# Patient Record
Sex: Female | Born: 2000 | Race: White | Hispanic: No | Marital: Single | Smoking: Never smoker
Health system: Southern US, Community
[De-identification: ages and names within clinical notes are randomized; demographics above are authoritative.]

---

## 2020-09-25 ENCOUNTER — Encounter: Payer: Self-pay | Admitting: *Deleted

## 2020-09-25 ENCOUNTER — Emergency Department (INDEPENDENT_AMBULATORY_CARE_PROVIDER_SITE_OTHER)
Admission: EM | Admit: 2020-09-25 | Discharge: 2020-09-25 | Disposition: A | Discharge status: Home / Self Care | Payer: 59 | Source: Home / Self Care

## 2020-09-25 ENCOUNTER — Other Ambulatory Visit: Payer: Self-pay

## 2020-09-25 ENCOUNTER — Emergency Department (INDEPENDENT_AMBULATORY_CARE_PROVIDER_SITE_OTHER): Payer: 59

## 2020-09-25 DIAGNOSIS — W231XXA Caught, crushed, jammed, or pinched between stationary objects, initial encounter: Secondary | ICD-10-CM | POA: Diagnosis not present

## 2020-09-25 DIAGNOSIS — M79644 Pain in right finger(s): Secondary | ICD-10-CM | POA: Diagnosis not present

## 2020-09-25 DIAGNOSIS — S6991XA Unspecified injury of right wrist, hand and finger(s), initial encounter: Secondary | ICD-10-CM | POA: Diagnosis not present

## 2020-09-25 NOTE — ED Provider Notes (Signed)
[facility]    CSN: [id number] Arrival date & time: 09/25/20  1052      History   Chief Complaint Chief Complaint  Patient presents with  . Finger Injury    HPI Miranda Wright is a 20 y.o. female.   HPI  Patient Miranda Wright today with a right third digit injury.  Patient reports her sister accidentally slammed her finger in the car door last evening.  She immediately applied ice to reduce swelling had some mild tenderness.  Today upon awakening her finger was more swollen from the PIP joint distally with some mild misalignment.  Patient can flex her finger however with extension there is some pain.  No prior injury or fracture to affected digit.  History reviewed. No pertinent past medical history.  There are no problems to display for this patient.   History reviewed. No pertinent surgical history.  OB History   No obstetric history on file.      Home Medications    Prior to Admission medications   Not on File    Family History History reviewed. No pertinent family history.  Social History Social History   Tobacco Use  . Smoking status: Never Smoker  . Smokeless tobacco: Never Used  Vaping Use  . Vaping Use: Never used  Substance Use Topics  . Alcohol use: Never  . Drug use: Never     Allergies   Patient has no known allergies.   Review of Systems Review of Systems Pertinent negatives listed in HPI  Physical Exam Triage Vital Signs ED Triage Vitals  Enc Vitals Group     BP 09/25/20 1116 130/85     Pulse Rate 09/25/20 1116 (!) 2     Resp 09/25/20 1116 18     Temp 09/25/20 1116 98.7 F (37.1 C)     Temp Source 09/25/20 1116 Oral     SpO2 09/25/20 1116 99 %     Weight 09/25/20 1113 115 lb (52.2 kg)     Height 09/25/20 1113 5\' 3"  (1.6 m)     Head Circumference --      Peak Flow --      Pain Score 09/25/20 1113 5     Pain Loc --      Pain Edu? --      Excl. in GC? --    No data found.  Updated Vital Signs BP 130/85 (BP Location:  Right Arm)   Pulse (!) 2   Temp 98.7 F (37.1 C) (Oral)   Resp 18   Ht 5\' 3"  (1.6 m)   Wt 115 lb (52.2 kg)   LMP 09/04/2020   SpO2 99%   BMI 20.37 kg/m   Visual Acuity Right Eye Distance:   Left Eye Distance:   Bilateral Distance:    Right Eye Near:   Left Eye Near:    Bilateral Near:     Physical Exam General appearance: alert, well developed, well nourished, cooperative  Head: Normocephalic, without obvious abnormality, atraumatic Respiratory: Respirations even and unlabored, normal respiratory rate Heart: rate and rhythm normal. No gallop or murmurs noted on exam  Abdomen: BS +, no distention, no rebound tenderness, or no mass Extremities:Right middle digit swelling, scant ecchymosis base of finger, full flexion pain with extension Skin: Skin color, texture, turgor normal. No rashes seen  Psych: Appropriate mood and affect. Neurologic: Mental status: Alert, oriented to person, place, and time, thought content appropriate.   UC Treatments / Results  Labs (all labs ordered are  listed, but only abnormal results are displayed) Labs Reviewed - No data to display  EKG   Radiology DG Hand Complete Right  Result Date: 09/25/2020 CLINICAL DATA:  Right hand pain after car door injury last night. EXAM: RIGHT HAND - COMPLETE 3+ VIEW COMPARISON:  None. FINDINGS: There is no evidence of fracture or dislocation. There is no evidence of arthropathy or other focal bone abnormality. Soft tissues are unremarkable. IMPRESSION: Negative. Electronically Signed   By: Lupita Raider M.D.   On: 09/25/2020 12:23     Procedures Procedures (including critical care time)  Medications Ordered in UC Medications - No data to display  Initial Impression / Assessment and Plan / UC Course  I have reviewed the triage vital signs and the nursing notes.  Pertinent labs & imaging results that were available during my care of the patient were reviewed by me and considered in my medical decision  making (see chart for details).     Right hand imaging, third digit is negative for any acute fracture.  Treating as a sprain finger has been splinted with Coban.  Patient encouraged to take at least 2-3 doses of ibuprofen and continue icing to reduce swelling over the next 1 to 2 days.  Follow-up with primary care if symptoms worsen or do not improve with prescribed therapy.  Final Clinical Impressions(s) / UC Diagnoses   Final diagnoses:  Finger injury, right, initial encounter     Discharge Instructions     Recommend taking 400 mg of ibuprofen at least 2-3 times until swelling and inflammation resolves.  Continue icing for the next 1 to 2 days to relieve residual swelling.  Continue use of the Coban over the next 1 to 3 days until full range of motion returns.  If symptoms worsen or do not improve follow-up with your primary care provider.  Your x-ray showed no fracture.    ED Prescriptions    None     PDMP not reviewed this encounter.   Bing Neighbors, FNP 09/25/20 1311

## 2020-09-25 NOTE — Discharge Instructions (Addendum)
Recommend taking 400 mg of ibuprofen at least 2-3 times until swelling and inflammation resolves.  Continue icing for the next 1 to 2 days to relieve residual swelling.  Continue use of the Coban over the next 1 to 3 days until full range of motion returns.  If symptoms worsen or do not improve follow-up with your primary care provider.  Your x-ray showed no fracture.

## 2020-09-25 NOTE — ED Triage Notes (Signed)
Pt closed her RT 3rd finger in a door last night. C/o pain, swelling and bruising. No OTC meds today. Iced this morning.

## 2021-12-30 IMAGING — DX DG HAND COMPLETE 3+V*R*
3 series · 3 of 3 positions shown · non-contrast
Comparison: None.

CLINICAL DATA: Right hand pain after car door injury last night.

EXAM:
RIGHT HAND - COMPLETE 3+ VIEW

[hand pa]
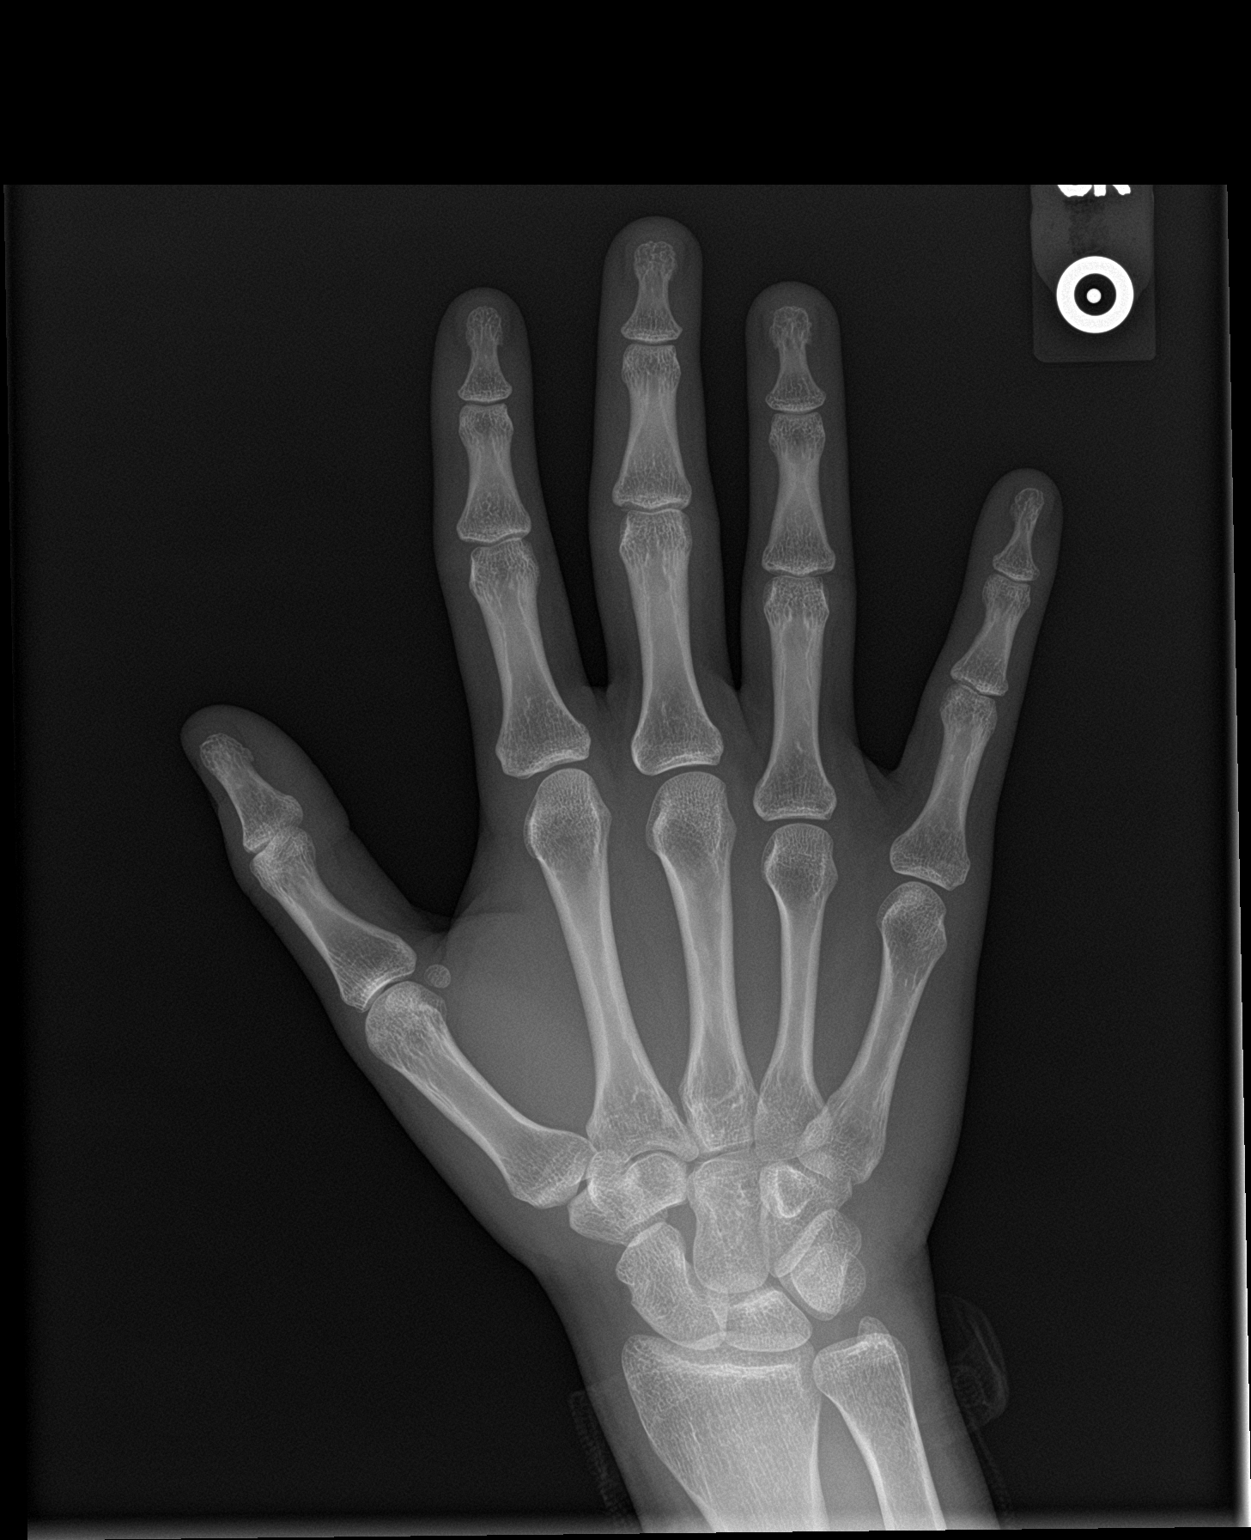

[hand obl]
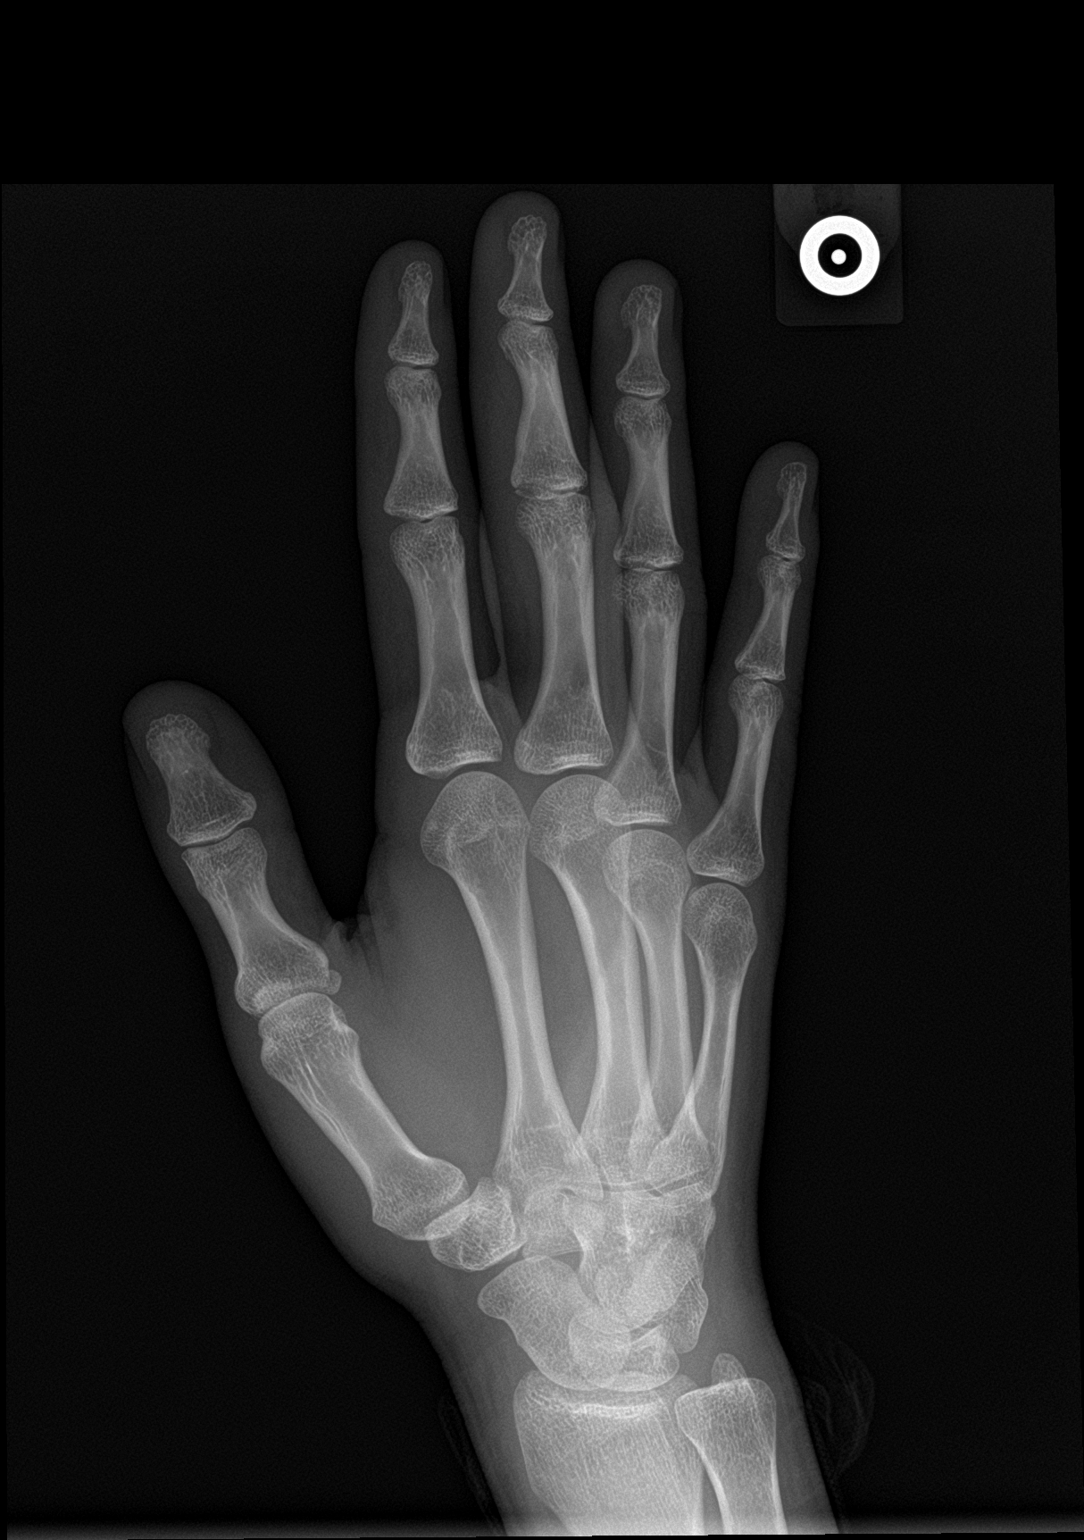

[hand lat]
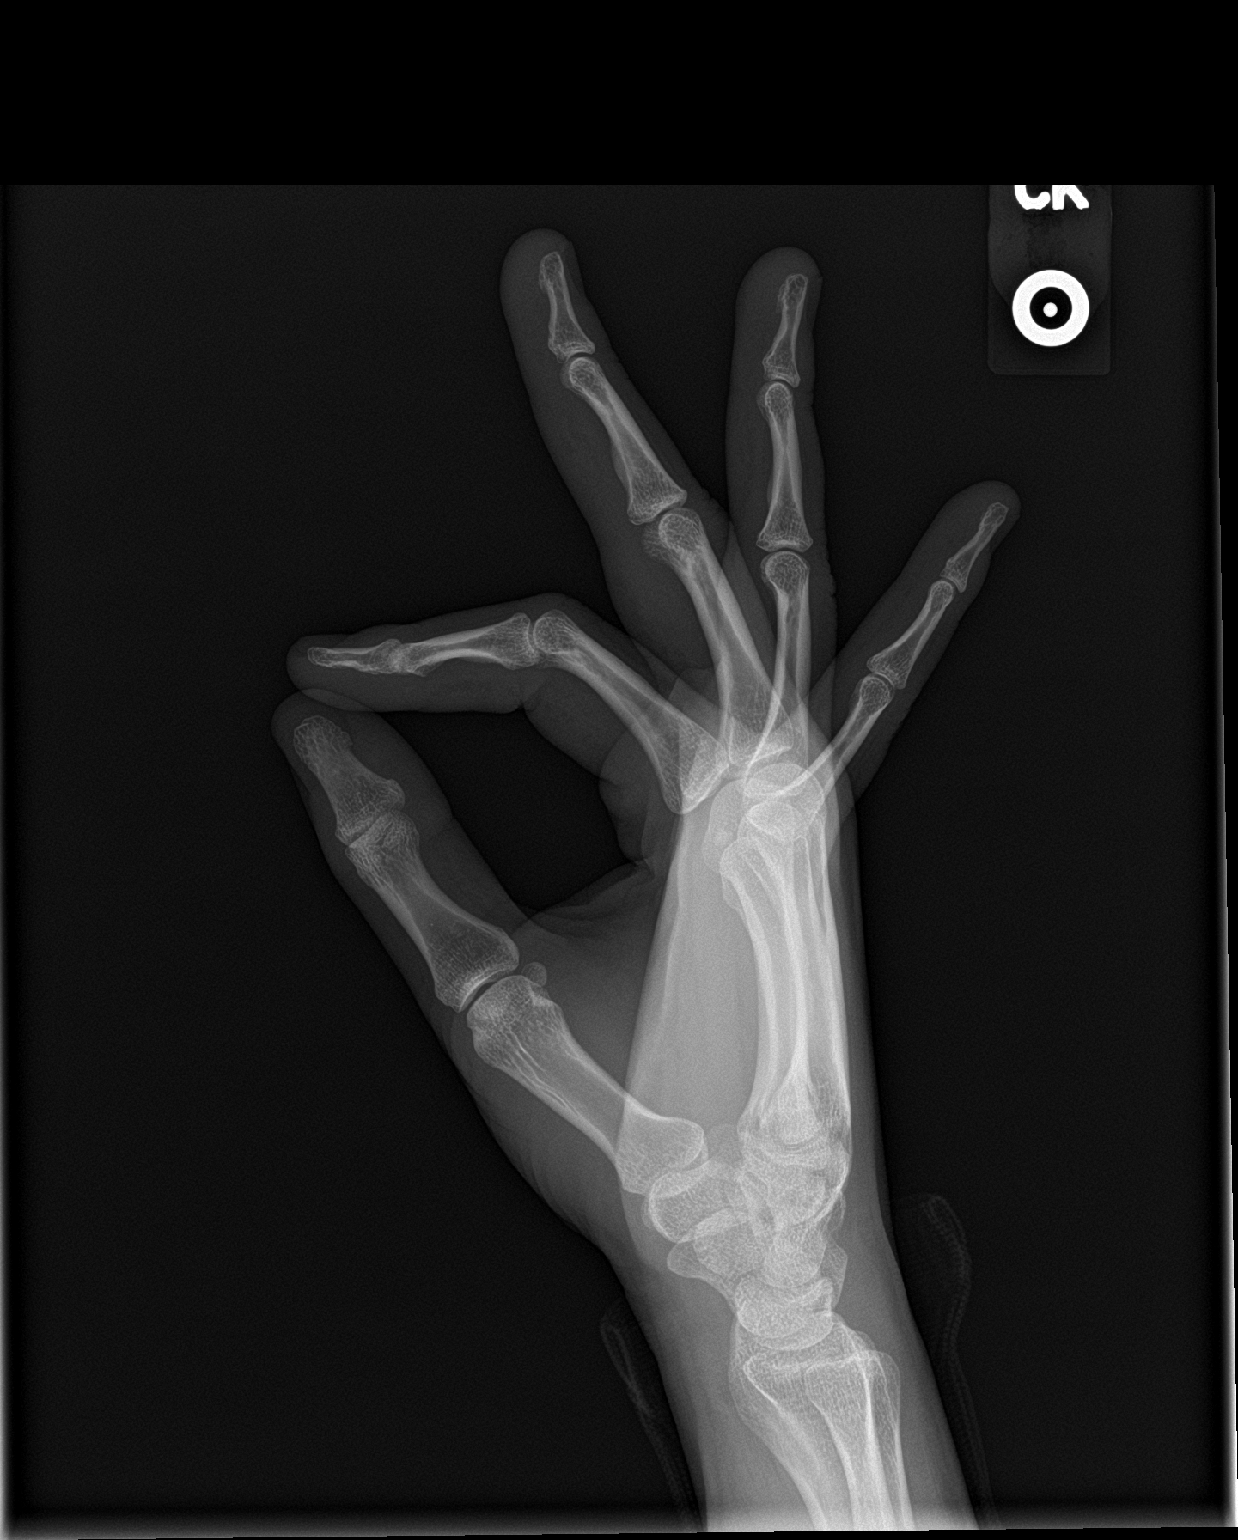

[3 of 3 positions shown; findings below may reference images not displayed]

FINDINGS: There is no evidence of fracture or dislocation. There is no
evidence of arthropathy or other focal bone abnormality. Soft
tissues are unremarkable.
IMPRESSION: Negative.
